# Patient Record
Sex: Male | Born: 1975 | Race: White | Hispanic: No | Smoking: Never smoker
Health system: Southern US, Community
[De-identification: ages and names within clinical notes are randomized; demographics above are authoritative.]

## PROBLEM LIST (undated history)

## (undated) DIAGNOSIS — K219 Gastro-esophageal reflux disease without esophagitis: Secondary | ICD-10-CM

## (undated) DIAGNOSIS — S62316A Displaced fracture of base of fifth metacarpal bone, right hand, initial encounter for closed fracture: Secondary | ICD-10-CM

## (undated) DIAGNOSIS — Z8489 Family history of other specified conditions: Secondary | ICD-10-CM

## (undated) HISTORY — PX: MOLE REMOVAL: SHX2046

---

## 2018-05-13 ENCOUNTER — Other Ambulatory Visit: Payer: Self-pay

## 2018-05-13 ENCOUNTER — Emergency Department: Payer: BLUE CROSS/BLUE SHIELD

## 2018-05-13 ENCOUNTER — Encounter: Payer: Self-pay | Admitting: Intensive Care

## 2018-05-13 ENCOUNTER — Emergency Department
Admission: EM | Admit: 2018-05-13 | Discharge: 2018-05-13 | Disposition: A | Discharge status: Home / Self Care | Payer: BLUE CROSS/BLUE SHIELD | Attending: Emergency Medicine | Admitting: Emergency Medicine

## 2018-05-13 DIAGNOSIS — Y92000 Kitchen of unspecified non-institutional (private) residence as  the place of occurrence of the external cause: Secondary | ICD-10-CM | POA: Diagnosis not present

## 2018-05-13 DIAGNOSIS — S62314A Displaced fracture of base of fourth metacarpal bone, right hand, initial encounter for closed fracture: Secondary | ICD-10-CM | POA: Diagnosis not present

## 2018-05-13 DIAGNOSIS — Y999 Unspecified external cause status: Secondary | ICD-10-CM | POA: Diagnosis not present

## 2018-05-13 DIAGNOSIS — Y939 Activity, unspecified: Secondary | ICD-10-CM | POA: Diagnosis not present

## 2018-05-13 DIAGNOSIS — W2209XA Striking against other stationary object, initial encounter: Secondary | ICD-10-CM | POA: Diagnosis not present

## 2018-05-13 DIAGNOSIS — S6991XA Unspecified injury of right wrist, hand and finger(s), initial encounter: Secondary | ICD-10-CM | POA: Diagnosis present

## 2018-05-13 MED ORDER — OXYCODONE HCL 5 MG PO TABS
5.0000 mg | ORAL_TABLET | Freq: Once | ORAL | Status: AC
  Administered 2018-05-13: 18:00:00 5 mg via ORAL
  Filled 2018-05-13: qty 1

## 2018-05-13 MED ORDER — NAPROXEN 500 MG PO TABS: 500.0000 mg | ORAL_TABLET | Freq: Two times a day (BID) | ORAL | 0 refills | Status: AC

## 2018-05-13 MED ORDER — OXYCODONE-ACETAMINOPHEN 10-325 MG PO TABS: 1.0000 | ORAL_TABLET | Freq: Four times a day (QID) | ORAL | 0 refills | Status: AC | PRN

## 2018-05-13 MED ORDER — NAPROXEN 500 MG PO TABS
500.0000 mg | ORAL_TABLET | Freq: Once | ORAL | Status: AC
  Administered 2018-05-13: 500 mg via ORAL
  Filled 2018-05-13: qty 1

## 2018-05-13 NOTE — ED Provider Notes (Signed)
[facility] Emergency Department Provider Note ____________________________________________  Time seen: Approximately 6:25 PM  I have reviewed the triage vital signs and the nursing notes.   HISTORY  Chief Complaint Hand Pain (right)    HPI Philip Ferrell is a 43 y.o. male who presents to the emergency department for evaluation and treatment of right hand pain. He reports a significant amount of stress at the moment. After a disagreement with his dad, he punched the refrigerator. He has broken the right hand twice before. Neither required surgical intervention. He is right hand dominant. No alleviating measures prior to arrival.  History reviewed. No pertinent past medical history.  There are no active problems to display for this patient.   History reviewed. No pertinent surgical history.  Prior to Admission medications   Medication Sig Start Date End Date Taking? Authorizing Provider  naproxen (NAPROSYN) 500 MG tablet Take 1 tablet (500 mg total) by mouth 2 (two) times daily with a meal. 05/13/18   Skarlett Sedlacek B, FNP  oxyCODONE-acetaminophen (PERCOCET) 10-325 MG tablet Take 1 tablet by mouth every 6 (six) hours as needed for pain. 05/13/18 05/13/19  Chinita Pester, FNP    Allergies Patient has no known allergies.  History reviewed. No pertinent family history.  Social History Social History   Tobacco Use  . Smoking status: Never Smoker  . Smokeless tobacco: Never Used  Substance Use Topics  . Alcohol use: Yes    Alcohol/week: 3.0 standard drinks    Types: 3 Cans of beer per week  . Drug use: Not Currently    Types: Marijuana    Review of Systems Constitutional: Negative for fever. Cardiovascular: Negative for chest pain. Respiratory: Negative for shortness of breath. Musculoskeletal: Positive for right hand pain. Skin: Positive for abrasions to the right hand.  Neurological: Negative for decrease in  sensation  ____________________________________________   PHYSICAL EXAM:  VITAL SIGNS: ED Triage Vitals [05/13/18 1739]  Enc Vitals Group     BP (!) 141/88     Pulse Rate 64     Resp 14     Temp 97.8 F (36.6 C)     Temp Source Oral     SpO2 100 %     Weight 200 lb (90.7 kg)     Height 5\' 10"  (1.778 m)     Head Circumference      Peak Flow      Pain Score 10     Pain Loc      Pain Edu?      Excl. in GC?     Constitutional: Alert and oriented. Well appearing and in no acute distress. Eyes: Conjunctivae are clear without discharge or drainage Head: Atraumatic Neck: Supple Respiratory: No cough. Respirations are even and unlabored. Musculoskeletal: Limited ROM of the right hand secondary to pain and swelling. Focal deformity at the base of the 4th and 5th metacarpals of the right hand.  Neurologic: Motor and sensory function of the right hand is intact.  Skin: Superficial abrasions noted over the MCP without active bleeding or lacerations.  Psychiatric: Affect and behavior are appropriate.  ____________________________________________   LABS (all labs ordered are listed, but only abnormal results are displayed)  Labs Reviewed - No data to display ____________________________________________  RADIOLOGY  Oblique mildly displaced fracture of the base of the fourth metacarpal with mild apex dorsal angulation and overlying soft tissue swelling.  ____________________________________________   PROCEDURES  .Splint Application Date/Time: 05/13/2018 8:41 PM Performed by: Chinita Pester, FNP  Authorized by: Chinita Pesterriplett, Filimon Miranda B, FNP   Consent:    Consent obtained:  Verbal   Consent given by:  Patient   Risks discussed:  Numbness, pain and swelling Pre-procedure details:    Sensation:  Normal Procedure details:    Laterality:  Right   Location:  Hand   Hand:  R hand   Splint type: Boxer's OCL.   Supplies:  Cotton padding, Ortho-Glass and elastic  bandage Post-procedure details:    Pain:  Improved   Sensation:  Normal   Patient tolerance of procedure:  Tolerated well, no immediate complications    ____________________________________________   INITIAL IMPRESSION / ASSESSMENT AND PLAN / ED COURSE  Philip Ferrell is a 43 y.o. who presents to the emergency department for treatment and evaluation of right hand pain after punching a refrigerator. Fourth metacarpal fracture on image is consistent with exam finding. Boxer's OCL placed. Patient reported decrease in pain after application. He will be discharged home and will call tomorrow to schedule an appointment with Dr. Martha ClanKrasinski. He was advised to return to the ER for symptoms of concern if unable to schedule an appointment.  Medications  naproxen (NAPROSYN) tablet 500 mg (500 mg Oral Given 05/13/18 1827)  oxyCODONE (Oxy IR/ROXICODONE) immediate release tablet 5 mg (5 mg Oral Given 05/13/18 1827)    Pertinent labs & imaging results that were available during my care of the patient were reviewed by me and considered in my medical decision making (see chart for details).  _________________________________________   FINAL CLINICAL IMPRESSION(S) / ED DIAGNOSES  Final diagnoses:  Closed displaced fracture of base of fourth metacarpal bone of right hand, initial encounter    ED Discharge Orders         Ordered    oxyCODONE-acetaminophen (PERCOCET) 10-325 MG tablet  Every 6 hours PRN     05/13/18 1841    naproxen (NAPROSYN) 500 MG tablet  2 times daily with meals     05/13/18 1841           If controlled substance prescribed during this visit, 12 month history viewed on the [facility] prior to issuing an initial prescription for Schedule II or III opiod.   Chinita Pesterriplett, Joey Hudock B, FNP 05/13/18 2043    Jeanmarie PlantMcShane, James A, MD 05/13/18 [phone]

## 2018-05-13 NOTE — Discharge Instructions (Signed)
Please call Dr. Samuel Germany office in the morning to request an appointment.  Apply ice over the cast off and on throughout the day.   Keep the hand elevated on a pillow as often as possible.  Return to the ER for symptoms that change or worsen if unable to see orthopedics right away.

## 2018-05-13 NOTE — ED Triage Notes (Signed)
Patient punched fridge today with right hand. Right hand has deformity noted

## 2018-05-16 ENCOUNTER — Encounter (HOSPITAL_BASED_OUTPATIENT_CLINIC_OR_DEPARTMENT_OTHER): Payer: Self-pay | Admitting: *Deleted

## 2018-05-16 ENCOUNTER — Other Ambulatory Visit: Payer: Self-pay

## 2018-05-17 ENCOUNTER — Ambulatory Visit (HOSPITAL_BASED_OUTPATIENT_CLINIC_OR_DEPARTMENT_OTHER): Payer: BLUE CROSS/BLUE SHIELD | Admitting: Anesthesiology

## 2018-05-17 ENCOUNTER — Encounter (HOSPITAL_BASED_OUTPATIENT_CLINIC_OR_DEPARTMENT_OTHER): Payer: Self-pay

## 2018-05-17 ENCOUNTER — Encounter (HOSPITAL_BASED_OUTPATIENT_CLINIC_OR_DEPARTMENT_OTHER)
Admission: RE | Disposition: A | Discharge status: Home / Self Care | Payer: Self-pay | Source: Home / Self Care | Attending: Orthopedic Surgery

## 2018-05-17 ENCOUNTER — Other Ambulatory Visit: Payer: Self-pay

## 2018-05-17 ENCOUNTER — Ambulatory Visit (HOSPITAL_BASED_OUTPATIENT_CLINIC_OR_DEPARTMENT_OTHER)
Admission: RE | Admit: 2018-05-17 | Discharge: 2018-05-17 | Disposition: A | Discharge status: Home / Self Care | Payer: BLUE CROSS/BLUE SHIELD | Attending: Orthopedic Surgery | Admitting: Orthopedic Surgery

## 2018-05-17 DIAGNOSIS — S63064A Dislocation of metacarpal (bone), proximal end of right hand, initial encounter: Secondary | ICD-10-CM | POA: Insufficient documentation

## 2018-05-17 DIAGNOSIS — X58XXXA Exposure to other specified factors, initial encounter: Secondary | ICD-10-CM | POA: Diagnosis not present

## 2018-05-17 DIAGNOSIS — S62314A Displaced fracture of base of fourth metacarpal bone, right hand, initial encounter for closed fracture: Secondary | ICD-10-CM | POA: Insufficient documentation

## 2018-05-17 HISTORY — DX: Family history of other specified conditions: Z84.89

## 2018-05-17 HISTORY — DX: Displaced fracture of base of fifth metacarpal bone, right hand, initial encounter for closed fracture: S62.316A

## 2018-05-17 HISTORY — PX: CLOSED REDUCTION METACARPAL WITH PERCUTANEOUS PINNING: SHX5613

## 2018-05-17 HISTORY — DX: Gastro-esophageal reflux disease without esophagitis: K21.9

## 2018-05-17 SURGERY — CLOSED REDUCTION, FRACTURE, METACARPAL BONE, WITH PERCUTANEOUS PINNING
Anesthesia: General | Site: Hand | Laterality: Right

## 2018-05-17 MED ORDER — LIDOCAINE 2% (20 MG/ML) 5 ML SYRINGE
INTRAMUSCULAR | Status: AC
  Filled 2018-05-17: qty 5

## 2018-05-17 MED ORDER — SCOPOLAMINE 1 MG/3DAYS TD PT72: 1.0000 | MEDICATED_PATCH | Freq: Once | TRANSDERMAL | Status: DC | PRN

## 2018-05-17 MED ORDER — MIDAZOLAM HCL 2 MG/2ML IJ SOLN
1.0000 mg | INTRAMUSCULAR | Status: DC | PRN
  Administered 2018-05-17: 2 mg via INTRAVENOUS

## 2018-05-17 MED ORDER — LIDOCAINE HCL (CARDIAC) PF 100 MG/5ML IV SOSY
PREFILLED_SYRINGE | INTRAVENOUS | Status: DC | PRN
  Administered 2018-05-17: 100 mg via INTRAVENOUS

## 2018-05-17 MED ORDER — FENTANYL CITRATE (PF) 100 MCG/2ML IJ SOLN
INTRAMUSCULAR | Status: AC
  Filled 2018-05-17: qty 2

## 2018-05-17 MED ORDER — FENTANYL CITRATE (PF) 100 MCG/2ML IJ SOLN
25.0000 ug | INTRAMUSCULAR | Status: DC | PRN
  Administered 2018-05-17 (×3): 25 ug via INTRAVENOUS

## 2018-05-17 MED ORDER — LACTATED RINGERS IV SOLN
INTRAVENOUS | Status: DC
  Administered 2018-05-17 (×2): via INTRAVENOUS

## 2018-05-17 MED ORDER — CHLORHEXIDINE GLUCONATE 4 % EX LIQD: 60.0000 mL | Freq: Once | CUTANEOUS | Status: DC

## 2018-05-17 MED ORDER — EPHEDRINE 5 MG/ML INJ
INTRAVENOUS | Status: AC
  Filled 2018-05-17: qty 10

## 2018-05-17 MED ORDER — ONDANSETRON HCL 4 MG/2ML IJ SOLN
INTRAMUSCULAR | Status: AC
  Filled 2018-05-17: qty 2

## 2018-05-17 MED ORDER — PHENYLEPHRINE 40 MCG/ML (10ML) SYRINGE FOR IV PUSH (FOR BLOOD PRESSURE SUPPORT)
PREFILLED_SYRINGE | INTRAVENOUS | Status: AC
  Filled 2018-05-17: qty 10

## 2018-05-17 MED ORDER — PROPOFOL 10 MG/ML IV BOLUS
INTRAVENOUS | Status: DC | PRN
  Administered 2018-05-17: 300 mg via INTRAVENOUS

## 2018-05-17 MED ORDER — OXYCODONE HCL 5 MG/5ML PO SOLN: 5.0000 mg | Freq: Once | ORAL | Status: DC | PRN

## 2018-05-17 MED ORDER — PROMETHAZINE HCL 25 MG/ML IJ SOLN: 6.2500 mg | INTRAMUSCULAR | Status: DC | PRN

## 2018-05-17 MED ORDER — DEXMEDETOMIDINE HCL IN NACL 200 MCG/50ML IV SOLN
INTRAVENOUS | Status: DC | PRN
  Administered 2018-05-17: 5 ug via INTRAVENOUS

## 2018-05-17 MED ORDER — OXYCODONE HCL 5 MG PO TABS
ORAL_TABLET | ORAL | Status: AC
  Filled 2018-05-17: qty 1

## 2018-05-17 MED ORDER — DEXAMETHASONE SODIUM PHOSPHATE 4 MG/ML IJ SOLN
INTRAMUSCULAR | Status: DC | PRN
  Administered 2018-05-17: 10 mg via INTRAVENOUS

## 2018-05-17 MED ORDER — CEFAZOLIN SODIUM-DEXTROSE 2-4 GM/100ML-% IV SOLN
INTRAVENOUS | Status: AC
  Filled 2018-05-17: qty 100

## 2018-05-17 MED ORDER — DEXAMETHASONE SODIUM PHOSPHATE 10 MG/ML IJ SOLN
INTRAMUSCULAR | Status: AC
  Filled 2018-05-17: qty 1

## 2018-05-17 MED ORDER — CEFAZOLIN SODIUM-DEXTROSE 2-4 GM/100ML-% IV SOLN
2.0000 g | INTRAVENOUS | Status: AC
  Administered 2018-05-17 (×2): 2 g via INTRAVENOUS

## 2018-05-17 MED ORDER — BUPIVACAINE HCL (PF) 0.25 % IJ SOLN
INTRAMUSCULAR | Status: AC
  Filled 2018-05-17: qty 30

## 2018-05-17 MED ORDER — BUPIVACAINE HCL (PF) 0.5 % IJ SOLN
INTRAMUSCULAR | Status: AC
  Filled 2018-05-17: qty 30

## 2018-05-17 MED ORDER — SUCCINYLCHOLINE CHLORIDE 200 MG/10ML IV SOSY
PREFILLED_SYRINGE | INTRAVENOUS | Status: AC
  Filled 2018-05-17: qty 10

## 2018-05-17 MED ORDER — FENTANYL CITRATE (PF) 100 MCG/2ML IJ SOLN
50.0000 ug | INTRAMUSCULAR | Status: AC | PRN
  Administered 2018-05-17: 100 ug via INTRAVENOUS
  Administered 2018-05-17 (×2): 25 ug via INTRAVENOUS

## 2018-05-17 MED ORDER — OXYCODONE HCL 5 MG PO TABS: 5.0000 mg | ORAL_TABLET | Freq: Once | ORAL | Status: DC | PRN

## 2018-05-17 SURGICAL SUPPLY — 63 items
BANDAGE ACE 3X5.8 VEL STRL LF (GAUZE/BANDAGES/DRESSINGS) ×8 IMPLANT
BANDAGE COBAN LF 1.5X5 NS (GAUZE/BANDAGES/DRESSINGS) IMPLANT
BLADE CLIPPER SURG (BLADE) IMPLANT
BLADE SURG 15 STRL LF DISP TIS (BLADE) ×2 IMPLANT
BLADE SURG 15 STRL SS (BLADE) ×2
BNDG CONFORM 3 STRL LF (GAUZE/BANDAGES/DRESSINGS) ×4 IMPLANT
BNDG GAUZE ELAST 4 BULKY (GAUZE/BANDAGES/DRESSINGS) IMPLANT
BRUSH SCRUB EZ PLAIN DRY (MISCELLANEOUS) ×4 IMPLANT
CANISTER SUCT 1200ML W/VALVE (MISCELLANEOUS) IMPLANT
CORD BIPOLAR FORCEPS 12FT (ELECTRODE) IMPLANT
COVER BACK TABLE REUSABLE LG (DRAPES) ×4 IMPLANT
COVER WAND RF STERILE (DRAPES) IMPLANT
CUFF TOURN SGL QUICK 18X4 (TOURNIQUET CUFF) ×4 IMPLANT
DECANTER SPIKE VIAL GLASS SM (MISCELLANEOUS) IMPLANT
DRAPE EXTREMITY T 121X128X90 (DISPOSABLE) ×4 IMPLANT
DRAPE OEC MINIVIEW 54X84 (DRAPES) ×4 IMPLANT
DRAPE SURG 17X23 STRL (DRAPES) ×4 IMPLANT
DRSG EMULSION OIL 3X3 NADH (GAUZE/BANDAGES/DRESSINGS) IMPLANT
GAUZE SPONGE 4X4 12PLY STRL (GAUZE/BANDAGES/DRESSINGS) ×4 IMPLANT
GAUZE XEROFORM 1X8 LF (GAUZE/BANDAGES/DRESSINGS) ×4 IMPLANT
GLOVE BIO SURGEON STRL SZ8 (GLOVE) IMPLANT
GLOVE BIOGEL M STRL SZ7.5 (GLOVE) IMPLANT
GLOVE SS BIOGEL STRL SZ 8 (GLOVE) ×2 IMPLANT
GLOVE SUPERSENSE BIOGEL SZ 8 (GLOVE) ×2
GOWN STRL REUS W/ TWL LRG LVL3 (GOWN DISPOSABLE) ×2 IMPLANT
GOWN STRL REUS W/ TWL XL LVL3 (GOWN DISPOSABLE) ×2 IMPLANT
GOWN STRL REUS W/TWL LRG LVL3 (GOWN DISPOSABLE) ×2
GOWN STRL REUS W/TWL XL LVL3 (GOWN DISPOSABLE) ×2
K-WIRE .062X4 (WIRE) ×8 IMPLANT
NEEDLE HYPO 22GX1.5 SAFETY (NEEDLE) IMPLANT
NEEDLE HYPO 25X1 1.5 SAFETY (NEEDLE) ×4 IMPLANT
NS IRRIG 1000ML POUR BTL (IV SOLUTION) ×4 IMPLANT
PACK BASIN DAY SURGERY FS (CUSTOM PROCEDURE TRAY) ×4 IMPLANT
PAD CAST 3X4 CTTN HI CHSV (CAST SUPPLIES) ×4 IMPLANT
PADDING CAST ABS 3INX4YD NS (CAST SUPPLIES)
PADDING CAST ABS 4INX4YD NS (CAST SUPPLIES)
PADDING CAST ABS COTTON 3X4 (CAST SUPPLIES) IMPLANT
PADDING CAST ABS COTTON 4X4 ST (CAST SUPPLIES) IMPLANT
PADDING CAST COTTON 3X4 STRL (CAST SUPPLIES) ×4
SHEET MEDIUM DRAPE 40X70 STRL (DRAPES) ×4 IMPLANT
SLEEVE SCD COMPRESS KNEE MED (MISCELLANEOUS) ×4 IMPLANT
SPLINT FIBERGLASS 3X35 (CAST SUPPLIES) ×4 IMPLANT
SPLINT FIBERGLASS 4X30 (CAST SUPPLIES) IMPLANT
SPLINT PLASTER CAST XFAST 3X15 (CAST SUPPLIES) IMPLANT
SPLINT PLASTER XTRA FASTSET 3X (CAST SUPPLIES)
STOCKINETTE 4X48 STRL (DRAPES) ×4 IMPLANT
STOCKINETTE SYNTHETIC 3 UNSTER (CAST SUPPLIES) ×4 IMPLANT
SUCTION FRAZIER HANDLE 10FR (MISCELLANEOUS)
SUCTION TUBE FRAZIER 10FR DISP (MISCELLANEOUS) IMPLANT
SUT BONE WAX W31G (SUTURE) IMPLANT
SUT CHROMIC 5 0 P 3 (SUTURE) IMPLANT
SUT PROLENE 4 0 PS 2 18 (SUTURE) IMPLANT
SUT PROLENE 5 0 P 3 (SUTURE) IMPLANT
SUT VIC AB 4-0 P-3 18XBRD (SUTURE) IMPLANT
SUT VIC AB 4-0 P3 18 (SUTURE)
SYR BULB 3OZ (MISCELLANEOUS) ×4 IMPLANT
SYR CONTROL 10ML LL (SYRINGE) ×4 IMPLANT
TAPE SURG TRANSPORE 1 IN (GAUZE/BANDAGES/DRESSINGS) ×2 IMPLANT
TAPE SURGICAL TRANSPORE 1 IN (GAUZE/BANDAGES/DRESSINGS) ×2
TOWEL GREEN STERILE FF (TOWEL DISPOSABLE) ×8 IMPLANT
TUBE CONNECTING 20'X1/4 (TUBING)
TUBE CONNECTING 20X1/4 (TUBING) IMPLANT
UNDERPAD 30X30 (UNDERPADS AND DIAPERS) ×4 IMPLANT

## 2018-05-17 NOTE — Anesthesia Postprocedure Evaluation (Signed)
Anesthesia Post Note  Patient: Philip Ferrell  Procedure(s) Performed: Closed reduction fourth and fifth carpometacarpal fracture dislocation right hand (Right Hand)     Patient location during evaluation: PACU Anesthesia Type: General Level of consciousness: awake and alert Pain management: pain level controlled Vital Signs Assessment: post-procedure vital signs reviewed and stable Respiratory status: spontaneous breathing, nonlabored ventilation and respiratory function stable Cardiovascular status: blood pressure returned to baseline and stable Postop Assessment: no apparent nausea or vomiting Anesthetic complications: no    Last Vitals:  Vitals:   05/17/18 1345 05/17/18 1400  BP: 129/83 139/83  Pulse: (!) 47 (!) 50  Resp: 10 (!) 9  Temp:    SpO2: 100% 100%    Last Pain:  Vitals:   05/17/18 1400  TempSrc:   PainSc: 7                  Beryle Lathe

## 2018-05-17 NOTE — Op Note (Signed)
Operative note 05/17/2018  Mikle Sternberg MD.  Preoperative diagnosis right hand fracture dislocation fourth and fifth carpometacarpal joints  Postop diagnosis: Same  Operative procedure: #1 closed reduction and pinning fifth carpometacarpal dislocation #2 closed reduction and pinning with 0.062 Kirschner wire fourth metacarpal fracture #3 5 view stress radiography performed examined and interpreted by myself  Surgeon Dominica Severin  Anesthesia General  Estimated blood loss minimal  Operative indications patient presents with fourth and fifth CMC fracture dislocations.  We are planning surgical avenues of care for stabilization.  All risk and benefits have been discussed.  Operative procedure: Patient was seen by myself and anesthesia taken to the operative theater.  Smooth induction of general anesthesia was accomplished.  Preoperative antibiotics were given and timeout was observed.  He was prepped with Hibiclens pre-scrub followed by attainment surgical Betadine scrub and paint following this the patient then underwent a closed reduction of the fifth CMC dislocation and pinning with 0.062 K wire.  Following this the fourth metacarpal was reduced and pinned in similar fashion.  This was a fourth and fifth CMC fracture dislocation pinning.  The fifth was dislocated the fourth was broken.  Excellent fixation was achieved.  I was able to re-create normal anatomy and integrity of the [facility] joints and all looked well.  5 the fluorographic and x-ray series performed examined and interpreted by myself looked to be excellent.  I made sure the pins were in proper position.  I then clipped the pins below the skin surface and we will remove these at 6 weeks or so postoperatively.  Patient tolerated this well.  He has pain medicine and spasm medicine already written.  He will see [place] in 2 weeks at 4 weeks removable brace and consideration for pin removal at 6 weeks with therapeutic endeavors.  All questions  have been addressed.     Juanita Streight MD.

## 2018-05-17 NOTE — H&P (Signed)
Philip Ferrell is an 43 y.o. male.   Chief Complaint: Right fourth and fifth CMC fracture dislocation HPI: Patient presents for evaluation and treatment of the of their upper extremity predicament. The patient denies neck, back, chest or  abdominal pain. The patient notes that they have no lower extremity problems. The patients primary complaint is noted. We are planning surgical care pathway for the upper extremity.  Past Medical History:  Diagnosis Date  . Closed disp fracture of base of fifth metacarpal bone of right hand    and 4th metacarpal  . Family history of adverse reaction to anesthesia    mother slow to awaken one time yrs ago  . GERD (gastroesophageal reflux disease) age 43   hx of    Past Surgical History:  Procedure Laterality Date  . MOLE REMOVAL  age 43 or [age]    History reviewed. No pertinent family history. Social History:  reports that he has never smoked. He has never used smokeless tobacco. He reports current alcohol use of about 3.0 standard drinks of alcohol per week. He reports previous drug use. Drug: Marijuana.  Allergies: No Known Allergies  Medications Prior to Admission  Medication Sig Dispense Refill  . HYDROcodone-acetaminophen (NORCO/VICODIN) 5-325 MG tablet Take 1 tablet by mouth every 6 (six) hours as needed for moderate pain. For after surgery    . methocarbamol (ROBAXIN) 500 MG tablet Take 500 mg by mouth every 6 (six) hours as needed for muscle spasms. For after surgery    . naproxen (NAPROSYN) 500 MG tablet Take 1 tablet (500 mg total) by mouth 2 (two) times daily with a meal. 30 tablet 0  . oxyCODONE-acetaminophen (PERCOCET) 10-325 MG tablet Take 1 tablet by mouth every 6 (six) hours as needed for pain. 20 tablet 0    No results found for this or any previous visit (from the past 48 hour(s)). No results found.  Review of Systems  Respiratory: Negative.   Cardiovascular: Negative.   Gastrointestinal: Negative.   Genitourinary: Negative.      Blood pressure 136/77, pulse 61, temperature 98.2 F (36.8 C), temperature source Oral, resp. rate 18, height 5\' 10"  (1.778 m), weight 90.3 kg, SpO2 100 %. Physical Exam right fourth and fifth CMC fracture dislocation The patient is alert and oriented in no acute distress. The patient complains of pain in the affected upper extremity.  The patient is noted to have a normal HEENT exam. Lung fields show equal chest expansion and no shortness of breath. Abdomen exam is nontender without distention. Lower extremity examination does not show any fracture dislocation or blood clot symptoms. Pelvis is stable and the neck and back are stable and nontender. Assessment/Plan Right fourth and fifth fracture dislocation [facility].  We will plan for reconstructive efforts directed at ORIF and recreation of the normal anatomy.  We are planning surgery for your upper extremity. The risk and benefits of surgery to include risk of bleeding, infection, anesthesia,  damage to normal structures and failure of the surgery to accomplish its intended goals of relieving symptoms and restoring function have been discussed in detail. With this in mind we plan to proceed. I have specifically discussed with the patient the pre-and postoperative regime and the dos and don'ts and risk and benefits in great detail. Risk and benefits of surgery also include risk of dystrophy(CRPS), chronic nerve pain, failure of the healing process to go onto completion and other inherent risks of surgery The relavent the pathophysiology of the disease/injury  process, as well as the alternatives for treatment and postoperative course of action has been discussed in great detail with the patient who desires to proceed.  We will do everything in our power to help you (the patient) restore function to the upper extremity. It is a pleasure to see this patient today.   Oletta Cohn III, MD 05/17/2018, 11:59 AM

## 2018-05-17 NOTE — Discharge Instructions (Signed)
Keep bandage clean and dry.  Call for any problems.  No smoking.  Criteria for driving a car: you should be off your pain medicine for 7-8 hours, able to drive one handed(confident), thinking clearly and feeling able in your judgement to drive. °Continue elevation as it will decrease swelling.  If instructed by MD move your fingers within the confines of the bandage/splint.  Use ice if instructed by your MD. Call immediately for any sudden loss of feeling in your hand/arm or change in functional abilities of the extremity.We recommend that you to take vitamin C 1000 mg a day to promote healing. °We also recommend that if you require  pain medicine that you take a stool softener to prevent constipation as most pain medicines will have constipation side effects. We recommend either Peri-Colace or Senokot and recommend that you also consider adding MiraLAX as well to prevent the constipation affects from pain medicine if you are required to use them. These medicines are over the counter and may be purchased at a local pharmacy. A cup of yogurt and a probiotic can also be helpful during the recovery process as the medicines can disrupt your intestinal environment. ° ° ° °Post Anesthesia Home Care Instructions ° °Activity: °Get plenty of rest for the remainder of the day. A responsible adult should stay with you for 24 hours following the procedure.  °For the next 24 hours, DO NOT: °-Drive a car °-Operate machinery °-Drink alcoholic beverages °-Take any medication unless instructed by your physician °-Make any legal decisions or sign important papers. ° °Meals: °Start with liquid foods such as gelatin or soup. Progress to regular foods as tolerated. Avoid greasy, spicy, heavy foods. If nausea and/or vomiting occur, drink only clear liquids until the nausea and/or vomiting subsides. Call your physician if vomiting continues. ° °Special Instructions/Symptoms: °Your throat may feel dry or sore from the anesthesia or the  breathing tube placed in your throat during surgery. If this causes discomfort, gargle with warm salt water. The discomfort should disappear within 24 hours. ° °If you had a scopolamine patch placed behind your ear for the management of post- operative nausea and/or vomiting: ° °1. The medication in the patch is effective for 72 hours, after which it should be removed.  Wrap patch in a tissue and discard in the trash. Wash hands thoroughly with soap and water. °2. You may remove the patch earlier than 72 hours if you experience unpleasant side effects which may include dry mouth, dizziness or visual disturbances. °3. Avoid touching the patch. Wash your hands with soap and water after contact with the patch. °  ° °

## 2018-05-17 NOTE — Transfer of Care (Signed)
Immediate Anesthesia Transfer of Care Note  Patient: Philip Ferrell  Procedure(s) Performed: Closed reduction fourth and fifth carpometacarpal fracture dislocation right hand (Right Hand)  Patient Location: PACU  Anesthesia Type:General  Level of Consciousness: oriented and patient cooperative  Airway & Oxygen Therapy: Patient Spontanous Breathing and Patient connected to nasal cannula oxygen  Post-op Assessment: Report given to RN and Post -op Vital signs reviewed and stable  Post vital signs: Reviewed and stable  Last Vitals:  Vitals Value Taken Time  BP    Temp    Pulse    Resp    SpO2      Last Pain:  Vitals:   05/17/18 0937  TempSrc: Oral  PainSc: 7       Patients Stated Pain Goal: 6 (05/17/18 3893)  Complications: No apparent anesthesia complications

## 2018-05-17 NOTE — Anesthesia Procedure Notes (Signed)
Procedure Name: LMA Insertion Date/Time: 05/17/2018 12:12 PM Performed by: Ronnette Hila, CRNA Pre-anesthesia Checklist: Patient identified, Emergency Drugs available, Suction available and Patient being monitored Patient Re-evaluated:Patient Re-evaluated prior to induction Oxygen Delivery Method: Circle system utilized Preoxygenation: Pre-oxygenation with 100% oxygen Induction Type: IV induction Ventilation: Mask ventilation without difficulty LMA: LMA inserted LMA Size: 5.0 Number of attempts: 1 Airway Equipment and Method: Bite block Placement Confirmation: positive ETCO2 Tube secured with: Tape Dental Injury: Teeth and Oropharynx as per pre-operative assessment

## 2018-05-17 NOTE — Anesthesia Preprocedure Evaluation (Addendum)
Anesthesia Evaluation  Patient identified by MRN, date of birth, ID band Patient awake    Reviewed: Allergy & Precautions, NPO status , Patient's Chart, lab work & pertinent test results  History of Anesthesia Complications Negative for: history of anesthetic complications  Airway Mallampati: II  TM Distance: >3 FB Neck ROM: Full    Dental  (+) Dental Advisory Given, Chipped,    Pulmonary neg pulmonary ROS,    breath sounds clear to auscultation       Cardiovascular negative cardio ROS   Rhythm:Regular Rate:Normal     Neuro/Psych negative neurological ROS  negative psych ROS   GI/Hepatic Neg liver ROS, GERD  Controlled,  Endo/Other  negative endocrine ROS  Renal/GU negative Renal ROS     Musculoskeletal negative musculoskeletal ROS (+)   Abdominal   Peds  Hematology negative hematology ROS (+)   Anesthesia Other Findings   Reproductive/Obstetrics                            Anesthesia Physical Anesthesia Plan  ASA: I  Anesthesia Plan: General   Post-op Pain Management:    Induction: Intravenous  PONV Risk Score and Plan: 3 and Treatment may vary due to age or medical condition, Ondansetron, Midazolam and Dexamethasone  Airway Management Planned: LMA  Additional Equipment: None  Intra-op Plan:   Post-operative Plan: Extubation in OR  Informed Consent: I have reviewed the patients History and Physical, chart, labs and discussed the procedure including the risks, benefits and alternatives for the proposed anesthesia with the patient or authorized representative who has indicated his/her understanding and acceptance.     Dental advisory given  Plan Discussed with: CRNA and Anesthesiologist  Anesthesia Plan Comments:        Anesthesia Quick Evaluation

## 2018-05-18 ENCOUNTER — Encounter (HOSPITAL_BASED_OUTPATIENT_CLINIC_OR_DEPARTMENT_OTHER): Payer: Self-pay | Admitting: Orthopedic Surgery

## 2019-09-13 IMAGING — DX RIGHT HAND - COMPLETE 3+ VIEW
3 series · 5 of 5 positions shown · non-contrast
Comparison: None.

CLINICAL DATA: Pain and swelling after punching a refrigerator

EXAM:
RIGHT HAND - COMPLETE 3+ VIEW

[hand ap]
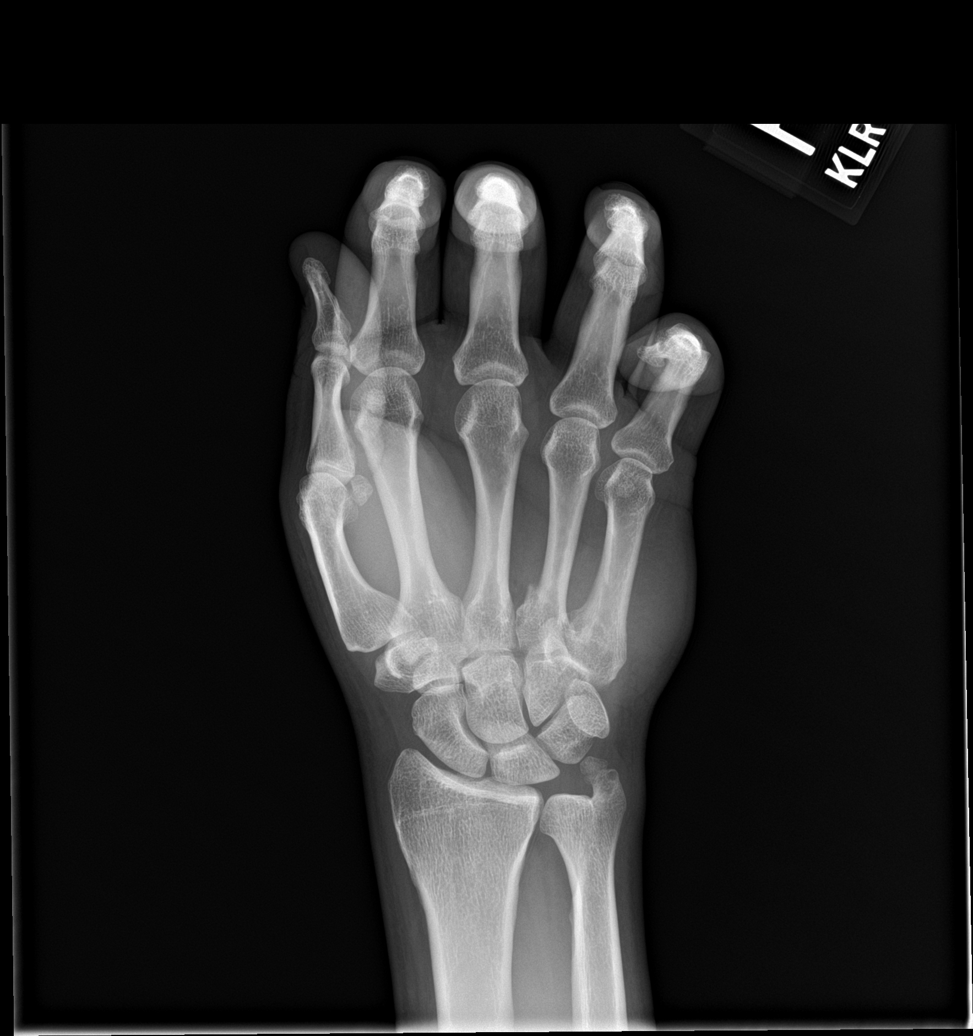

[Series 2: hand obl · 0.14mm/px · 2 of 2 slices shown]
[im 1/2]
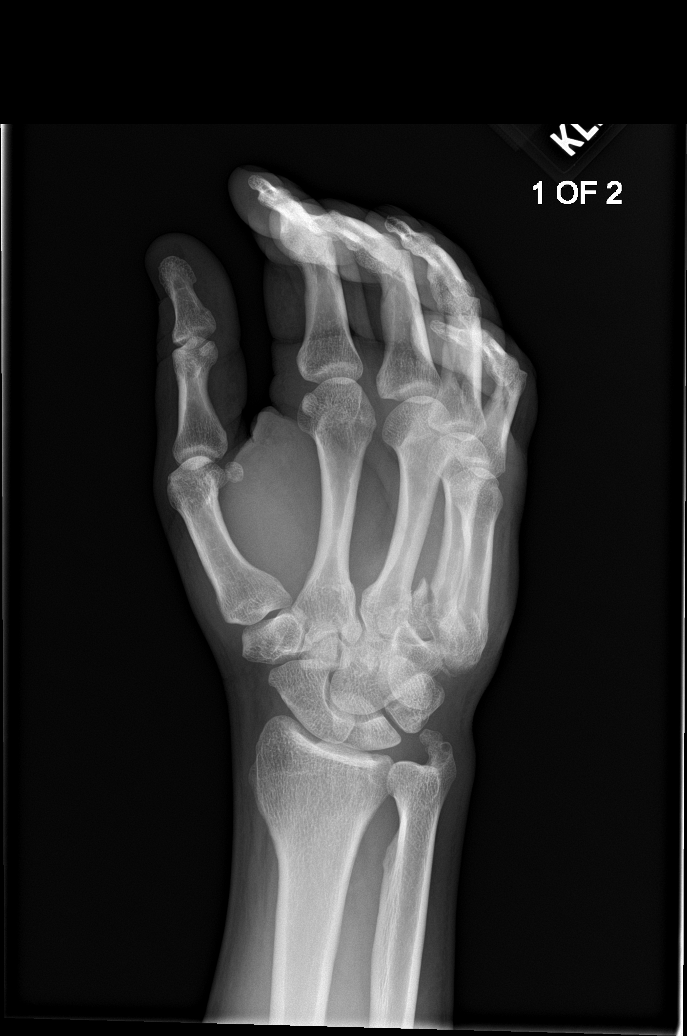
[im 2/2]
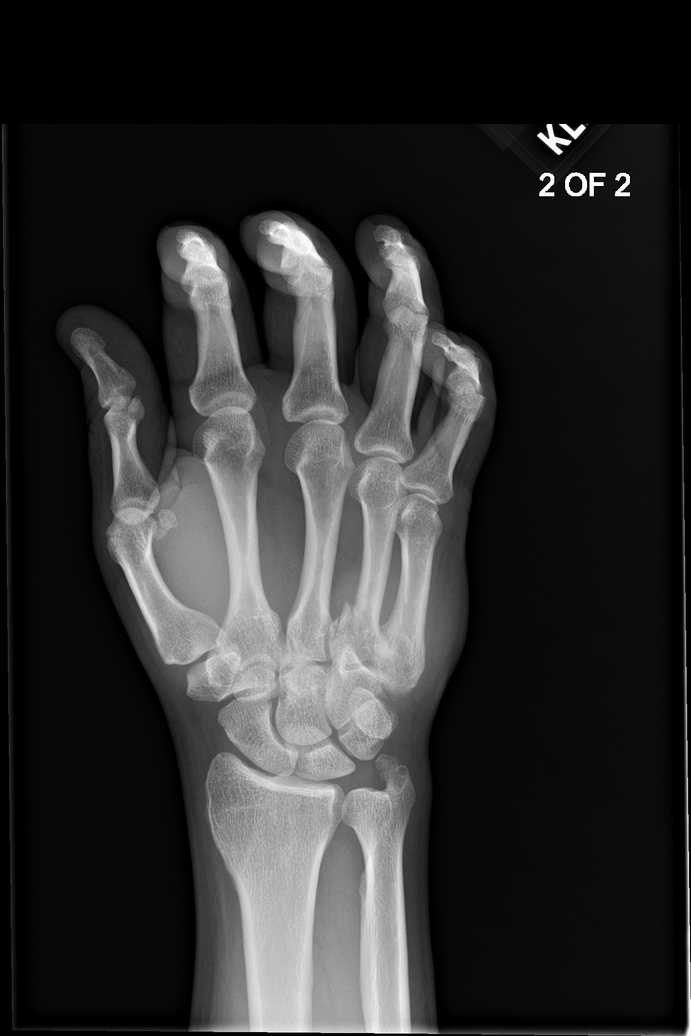

[Series 3: hand lat · 0.14mm/px · 2 of 2 slices shown]
[im 1/2]
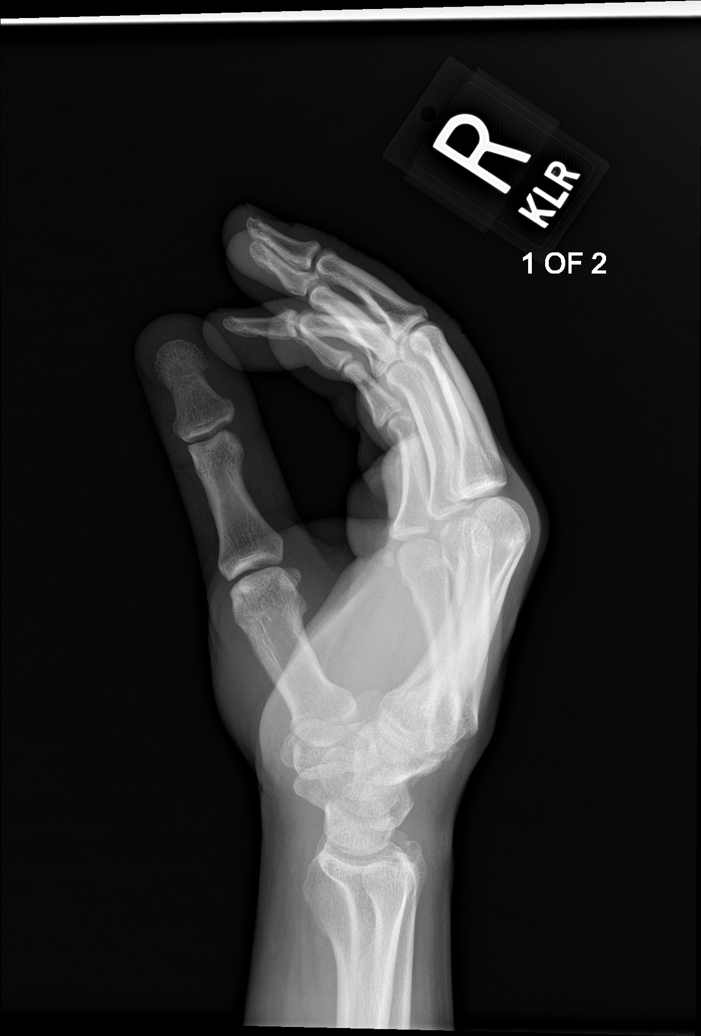
[im 2/2]
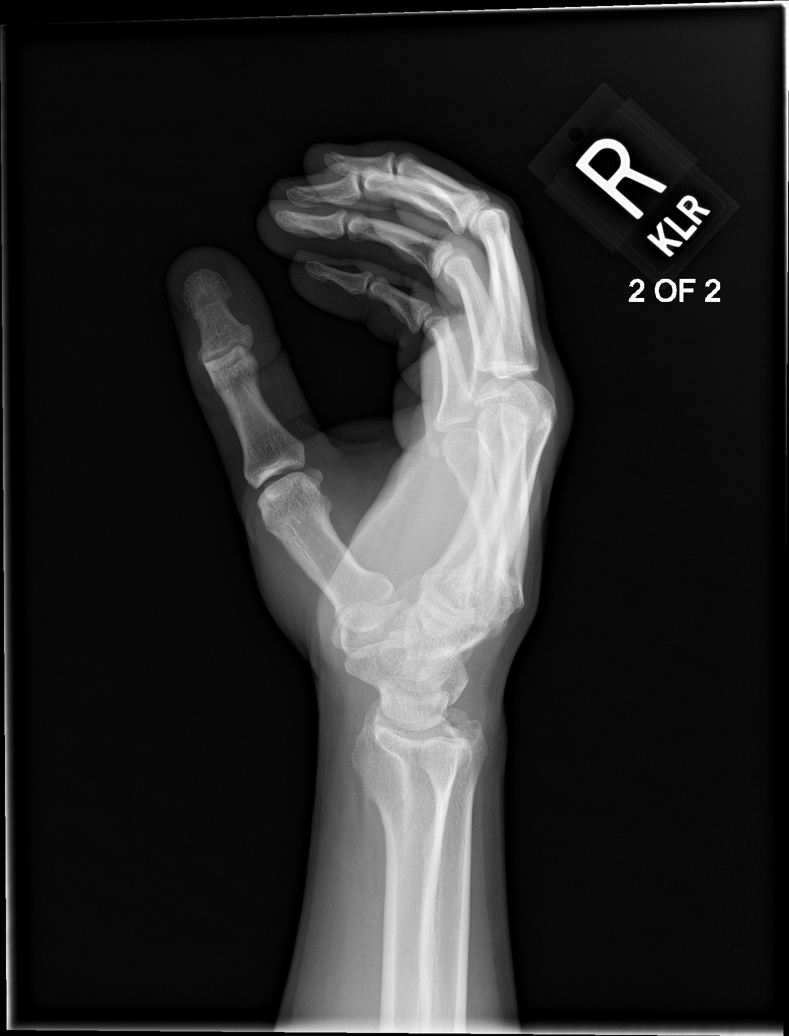

[5 of 5 positions shown; findings below may reference images not displayed]

FINDINGS: Oblique mildly displaced fracture of the base of the fourth
metacarpal with mild apex dorsal angulation and overlying soft
tissue swelling. Small bony fragment adjacent to the dorsal aspect
of the hamate and base of the fifth metacarpal which may reflect a
small fracture of either of the 2. No other fracture or dislocation.
Well corticated bony fragment adjacent to the ulnar styloid process.
IMPRESSION: Oblique mildly displaced fracture of the base of the fourth
metacarpal with mild apex dorsal angulation and overlying soft
tissue swelling. Small bony fragment adjacent to the dorsal aspect
of the hamate and base of the fifth metacarpal which may reflect a
small fracture of either of the two structures.
